# Patient Record
Sex: Female | Born: 1980 | Race: White | Hispanic: No | Marital: Married | Smoking: Current some day smoker
Health system: Southern US, Community
[De-identification: ages and names within clinical notes are randomized; demographics above are authoritative.]

---

## 2009-05-29 ENCOUNTER — Ambulatory Visit: Payer: Self-pay | Admitting: Family Medicine

## 2010-08-03 ENCOUNTER — Ambulatory Visit: Payer: Self-pay | Admitting: Internal Medicine

## 2010-11-14 ENCOUNTER — Ambulatory Visit: Payer: Self-pay | Admitting: Internal Medicine

## 2011-02-23 ENCOUNTER — Emergency Department: Payer: Self-pay | Admitting: Emergency Medicine

## 2011-04-30 ENCOUNTER — Emergency Department: Payer: Self-pay | Admitting: Emergency Medicine

## 2011-12-20 ENCOUNTER — Ambulatory Visit: Payer: Self-pay | Admitting: Family Medicine

## 2011-12-20 LAB — URINALYSIS, COMPLETE
Bacteria: NEGATIVE
Bilirubin,UR: NEGATIVE
Glucose,UR: NEGATIVE mg/dL (ref 0–75)
Leukocyte Esterase: NEGATIVE
Ph: 7 (ref 4.5–8.0)

## 2012-01-18 ENCOUNTER — Ambulatory Visit: Payer: Self-pay | Admitting: Emergency Medicine

## 2012-07-25 ENCOUNTER — Encounter: Payer: Self-pay | Admitting: Obstetrics and Gynecology

## 2012-08-10 ENCOUNTER — Ambulatory Visit: Payer: Self-pay

## 2012-08-10 LAB — CBC WITH DIFFERENTIAL/PLATELET
Basophil #: 0 10*3/uL (ref 0.0–0.1)
Basophil %: 0.5 %
Eosinophil #: 0.2 10*3/uL (ref 0.0–0.7)
HCT: 37.8 % (ref 35.0–47.0)
HGB: 12.8 g/dL (ref 12.0–16.0)
Lymphocyte %: 30.6 %
MCH: 30.2 pg (ref 26.0–34.0)
MCHC: 33.9 g/dL (ref 32.0–36.0)
MCV: 89 fL (ref 80–100)
Monocyte #: 0.7 x10 3/mm (ref 0.2–0.9)
Monocyte %: 10.4 %
Platelet: 210 10*3/uL (ref 150–440)
RBC: 4.25 10*6/uL (ref 3.80–5.20)
WBC: 6.6 10*3/uL (ref 3.6–11.0)

## 2012-08-10 LAB — BASIC METABOLIC PANEL
Anion Gap: 8 (ref 7–16)
BUN: 7 mg/dL (ref 7–18)
Calcium, Total: 8.7 mg/dL (ref 8.5–10.1)
Co2: 24 mmol/L (ref 21–32)
Creatinine: 0.54 mg/dL — ABNORMAL LOW (ref 0.60–1.30)
EGFR (African American): >60
Osmolality: 271 (ref 275–301)

## 2012-08-11 ENCOUNTER — Ambulatory Visit: Payer: Self-pay

## 2012-08-15 ENCOUNTER — Emergency Department: Payer: Self-pay | Admitting: Emergency Medicine

## 2012-08-15 LAB — CBC
HCT: 37.8 % (ref 35.0–47.0)
HGB: 13.4 g/dL (ref 12.0–16.0)
MCH: 31.2 pg (ref 26.0–34.0)
MCV: 88 fL (ref 80–100)
Platelet: 190 10*3/uL (ref 150–440)
RBC: 4.31 10*6/uL (ref 3.80–5.20)
RDW: 12.8 % (ref 11.5–14.5)
WBC: 9.7 10*3/uL (ref 3.6–11.0)

## 2012-08-15 LAB — COMPREHENSIVE METABOLIC PANEL
Alkaline Phosphatase: 74 U/L (ref 50–136)
Anion Gap: 6 — ABNORMAL LOW (ref 7–16)
BUN: 8 mg/dL (ref 7–18)
Bilirubin,Total: 0.9 mg/dL (ref 0.2–1.0)
Calcium, Total: 8.7 mg/dL (ref 8.5–10.1)
EGFR (African American): >60
EGFR (Non-African Amer.): >60
Glucose: 99 mg/dL (ref 65–99)
Potassium: 3.2 mmol/L — ABNORMAL LOW (ref 3.5–5.1)
Sodium: 137 mmol/L (ref 136–145)

## 2012-08-15 LAB — HCG, QUANTITATIVE, PREGNANCY: Beta Hcg, Quant.: 1928 m[IU]/mL — ABNORMAL HIGH

## 2012-11-18 ENCOUNTER — Emergency Department: Payer: Self-pay | Admitting: Emergency Medicine

## 2012-11-18 LAB — CBC
MCHC: 34.7 g/dL (ref 32.0–36.0)
MCV: 88 fL (ref 80–100)
RDW: 12.9 % (ref 11.5–14.5)
WBC: 10.5 10*3/uL (ref 3.6–11.0)

## 2012-11-19 LAB — URINALYSIS, COMPLETE
Bilirubin,UR: NEGATIVE
Glucose,UR: NEGATIVE mg/dL (ref 0–75)
Ph: 8 (ref 4.5–8.0)
Protein: NEGATIVE
RBC,UR: 6 /HPF (ref 0–5)
Squamous Epithelial: 6
WBC UR: 1 /HPF (ref 0–5)

## 2012-11-19 LAB — HCG, QUANTITATIVE, PREGNANCY: Beta Hcg, Quant.: 416 m[IU]/mL — ABNORMAL HIGH

## 2012-11-19 LAB — GC/CHLAMYDIA PROBE AMP

## 2012-11-19 LAB — WET PREP, GENITAL

## 2013-01-04 ENCOUNTER — Ambulatory Visit: Payer: Self-pay | Admitting: Physician Assistant

## 2013-01-04 LAB — URINALYSIS, COMPLETE
Glucose,UR: NEGATIVE mg/dL (ref 0–75)
Nitrite: NEGATIVE
Ph: 7 (ref 4.5–8.0)
Protein: NEGATIVE
WBC UR: NONE SEEN /HPF (ref 0–5)

## 2013-01-04 LAB — COMPREHENSIVE METABOLIC PANEL WITH GFR
Albumin: 4.3 g/dL
Alkaline Phosphatase: 63 U/L
Anion Gap: 7
BUN: 9 mg/dL
Bilirubin,Total: 0.9 mg/dL
Calcium, Total: 9.1 mg/dL
Chloride: 104 mmol/L
Co2: 29 mmol/L
Creatinine: 0.81 mg/dL
EGFR (African American): >60
EGFR (Non-African Amer.): >60
Glucose: 93 mg/dL
Osmolality: 278
Potassium: 3.6 mmol/L
SGOT(AST): 14 U/L — ABNORMAL LOW
SGPT (ALT): 21 U/L
Sodium: 140 mmol/L
Total Protein: 7.4 g/dL

## 2013-01-04 LAB — CBC WITH DIFFERENTIAL/PLATELET
Basophil #: 0.1 x10 3/mm 3
Basophil %: 1.4 %
Eosinophil #: 0.1 x10 3/mm 3
Eosinophil %: 1.7 %
HCT: 39.1 %
HGB: 13.5 g/dL
Lymphocyte %: 34.1 %
Lymphs Abs: 2.6 x10 3/mm 3
MCH: 29.8 pg
MCHC: 34.5 g/dL
MCV: 87 fL
Monocyte #: 0.6 "x10 3/mm "
Monocyte %: 8.3 %
Neutrophil #: 4.1 x10 3/mm 3
Neutrophil %: 54.5 %
Platelet: 234 x10 3/mm 3
RBC: 4.53 X10 6/mm 3
RDW: 12.2 %
WBC: 7.6 x10 3/mm 3

## 2013-01-04 LAB — AMYLASE: Amylase: 43 U/L

## 2013-02-17 ENCOUNTER — Emergency Department: Payer: Self-pay | Admitting: Emergency Medicine

## 2013-02-18 LAB — CBC
MCH: 30.6 pg (ref 26.0–34.0)
MCHC: 35 g/dL (ref 32.0–36.0)
Platelet: 216 10*3/uL (ref 150–440)
RDW: 12.7 % (ref 11.5–14.5)
WBC: 10.7 10*3/uL (ref 3.6–11.0)

## 2013-02-18 LAB — URINALYSIS, COMPLETE
Bilirubin,UR: NEGATIVE
Blood: NEGATIVE
Glucose,UR: NEGATIVE mg/dL (ref 0–75)
Leukocyte Esterase: NEGATIVE
Nitrite: NEGATIVE
Ph: 8 (ref 4.5–8.0)
Protein: NEGATIVE
RBC,UR: 1 /HPF (ref 0–5)
Specific Gravity: 1.009 (ref 1.003–1.030)
WBC UR: <1 /HPF (ref 0–5)

## 2013-02-18 LAB — BASIC METABOLIC PANEL WITH GFR
Anion Gap: 5 — ABNORMAL LOW
BUN: 10 mg/dL
Calcium, Total: 9.1 mg/dL
Chloride: 107 mmol/L
Co2: 27 mmol/L
Creatinine: 0.81 mg/dL
EGFR (African American): >60
EGFR (Non-African Amer.): >60
Glucose: 99 mg/dL
Osmolality: 277
Potassium: 3.6 mmol/L
Sodium: 139 mmol/L

## 2013-02-18 LAB — GC/CHLAMYDIA PROBE AMP

## 2013-02-18 LAB — WET PREP, GENITAL

## 2013-03-07 ENCOUNTER — Ambulatory Visit: Payer: Self-pay | Admitting: Gastroenterology

## 2013-03-08 ENCOUNTER — Ambulatory Visit: Payer: Self-pay | Admitting: Gastroenterology

## 2013-05-30 ENCOUNTER — Emergency Department: Payer: Self-pay | Admitting: Emergency Medicine

## 2013-05-30 LAB — CBC WITH DIFFERENTIAL/PLATELET
BASOS ABS: 0 10*3/uL (ref 0.0–0.1)
BASOS PCT: 0.6 %
Eosinophil #: 0.4 10*3/uL (ref 0.0–0.7)
Eosinophil %: 4 %
HCT: 39.3 % (ref 35.0–47.0)
HGB: 13.2 g/dL (ref 12.0–16.0)
LYMPHS PCT: 36.5 %
Lymphocyte #: 3.2 10*3/uL (ref 1.0–3.6)
MCH: 30.3 pg (ref 26.0–34.0)
MCHC: 33.6 g/dL (ref 32.0–36.0)
MCV: 90 fL (ref 80–100)
MONO ABS: 0.7 x10 3/mm (ref 0.2–0.9)
MONOS PCT: 8.2 %
NEUTROS ABS: 4.4 10*3/uL (ref 1.4–6.5)
Neutrophil %: 50.7 %
PLATELETS: 224 10*3/uL (ref 150–440)
RBC: 4.36 10*6/uL (ref 3.80–5.20)
RDW: 12.6 % (ref 11.5–14.5)
WBC: 8.7 10*3/uL (ref 3.6–11.0)

## 2013-05-30 LAB — COMPREHENSIVE METABOLIC PANEL
ALBUMIN: 4.2 g/dL (ref 3.4–5.0)
ALT: 29 U/L (ref 12–78)
Alkaline Phosphatase: 59 U/L
Anion Gap: 6 — ABNORMAL LOW (ref 7–16)
BILIRUBIN TOTAL: 0.3 mg/dL (ref 0.2–1.0)
BUN: 10 mg/dL (ref 7–18)
CALCIUM: 8.8 mg/dL (ref 8.5–10.1)
CREATININE: 0.64 mg/dL (ref 0.60–1.30)
Chloride: 103 mmol/L (ref 98–107)
Co2: 26 mmol/L (ref 21–32)
EGFR (Non-African Amer.): >60
Glucose: 126 mg/dL — ABNORMAL HIGH (ref 65–99)
Osmolality: 271 (ref 275–301)
POTASSIUM: 3.4 mmol/L — AB (ref 3.5–5.1)
SGOT(AST): 25 U/L (ref 15–37)
Sodium: 135 mmol/L — ABNORMAL LOW (ref 136–145)
Total Protein: 7 g/dL (ref 6.4–8.2)

## 2013-05-30 LAB — URINALYSIS, COMPLETE
BILIRUBIN, UR: NEGATIVE
Glucose,UR: NEGATIVE mg/dL (ref 0–75)
KETONE: NEGATIVE
Leukocyte Esterase: NEGATIVE
NITRITE: NEGATIVE
PROTEIN: NEGATIVE
Ph: 6 (ref 4.5–8.0)
SPECIFIC GRAVITY: 1.018 (ref 1.003–1.030)
Squamous Epithelial: 9
WBC UR: <1 /HPF (ref 0–5)

## 2013-06-01 LAB — URINE CULTURE

## 2013-06-06 ENCOUNTER — Ambulatory Visit: Payer: Self-pay | Admitting: Gastroenterology

## 2013-06-06 LAB — HCG, QUANTITATIVE, PREGNANCY

## 2013-07-05 ENCOUNTER — Encounter: Payer: Self-pay | Admitting: Nurse Practitioner

## 2013-07-09 ENCOUNTER — Encounter: Payer: Self-pay | Admitting: Nurse Practitioner

## 2013-08-30 ENCOUNTER — Ambulatory Visit: Payer: Self-pay | Admitting: Physician Assistant

## 2013-09-14 ENCOUNTER — Ambulatory Visit: Payer: Self-pay | Admitting: Gastroenterology

## 2013-12-06 ENCOUNTER — Emergency Department: Payer: Self-pay | Admitting: Emergency Medicine

## 2013-12-06 LAB — BASIC METABOLIC PANEL
ANION GAP: 5 — AB (ref 7–16)
BUN: 8 mg/dL (ref 7–18)
CO2: 30 mmol/L (ref 21–32)
Calcium, Total: 8.7 mg/dL (ref 8.5–10.1)
Chloride: 104 mmol/L (ref 98–107)
Creatinine: 0.76 mg/dL (ref 0.60–1.30)
EGFR (African American): >60
EGFR (Non-African Amer.): >60
Glucose: 95 mg/dL (ref 65–99)
Osmolality: 276 (ref 275–301)
POTASSIUM: 3.4 mmol/L — AB (ref 3.5–5.1)
SODIUM: 139 mmol/L (ref 136–145)

## 2013-12-06 LAB — CBC WITH DIFFERENTIAL/PLATELET
Basophil #: 0.1 10*3/uL (ref 0.0–0.1)
Basophil %: 0.8 %
Eosinophil #: 0.4 10*3/uL (ref 0.0–0.7)
Eosinophil %: 4 %
HCT: 42.1 % (ref 35.0–47.0)
HGB: 13.9 g/dL (ref 12.0–16.0)
LYMPHS PCT: 39.5 %
Lymphocyte #: 4 10*3/uL — ABNORMAL HIGH (ref 1.0–3.6)
MCH: 30 pg (ref 26.0–34.0)
MCHC: 33 g/dL (ref 32.0–36.0)
MCV: 91 fL (ref 80–100)
Monocyte #: 0.7 x10 3/mm (ref 0.2–0.9)
Monocyte %: 7.1 %
NEUTROS PCT: 48.6 %
Neutrophil #: 4.9 10*3/uL (ref 1.4–6.5)
Platelet: 236 10*3/uL (ref 150–440)
RBC: 4.63 10*6/uL (ref 3.80–5.20)
RDW: 12.7 % (ref 11.5–14.5)
WBC: 10.1 10*3/uL (ref 3.6–11.0)

## 2013-12-06 LAB — URINALYSIS, COMPLETE
BACTERIA: NONE SEEN
Bilirubin,UR: NEGATIVE
GLUCOSE, UR: NEGATIVE mg/dL (ref 0–75)
KETONE: NEGATIVE
LEUKOCYTE ESTERASE: NEGATIVE
Nitrite: NEGATIVE
Ph: 7 (ref 4.5–8.0)
Protein: NEGATIVE
RBC,UR: 3 /HPF (ref 0–5)
SPECIFIC GRAVITY: 1.006 (ref 1.003–1.030)
WBC UR: 1 /HPF (ref 0–5)

## 2013-12-26 ENCOUNTER — Ambulatory Visit: Payer: Self-pay | Admitting: Internal Medicine

## 2014-05-17 ENCOUNTER — Emergency Department: Payer: Self-pay | Admitting: Emergency Medicine

## 2014-05-22 ENCOUNTER — Ambulatory Visit: Payer: Self-pay | Admitting: Otolaryngology

## 2014-05-23 ENCOUNTER — Emergency Department: Payer: Self-pay | Admitting: Emergency Medicine

## 2014-05-23 LAB — URINALYSIS, COMPLETE
Bacteria: NONE SEEN
Bilirubin,UR: NEGATIVE
GLUCOSE, UR: NEGATIVE mg/dL (ref 0–75)
Ketone: NEGATIVE
Leukocyte Esterase: NEGATIVE
NITRITE: NEGATIVE
PROTEIN: NEGATIVE
Ph: 6 (ref 4.5–8.0)
RBC,UR: <1 /HPF (ref 0–5)
SPECIFIC GRAVITY: 1.005 (ref 1.003–1.030)
Squamous Epithelial: 1
WBC UR: <1 /HPF (ref 0–5)

## 2014-05-23 LAB — COMPREHENSIVE METABOLIC PANEL
ALBUMIN: 4.3 g/dL (ref 3.4–5.0)
ALK PHOS: 70 U/L
Anion Gap: 8 (ref 7–16)
BUN: 14 mg/dL (ref 7–18)
Bilirubin,Total: 0.3 mg/dL (ref 0.2–1.0)
Calcium, Total: 9.2 mg/dL (ref 8.5–10.1)
Chloride: 103 mmol/L (ref 98–107)
Co2: 27 mmol/L (ref 21–32)
Creatinine: 0.89 mg/dL (ref 0.60–1.30)
EGFR (African American): >60
EGFR (Non-African Amer.): >60
GLUCOSE: 172 mg/dL — AB (ref 65–99)
OSMOLALITY: 280 (ref 275–301)
POTASSIUM: 3.8 mmol/L (ref 3.5–5.1)
SGOT(AST): 14 U/L — ABNORMAL LOW (ref 15–37)
SGPT (ALT): 20 U/L
SODIUM: 138 mmol/L (ref 136–145)
TOTAL PROTEIN: 7.7 g/dL (ref 6.4–8.2)

## 2014-05-23 LAB — CBC
HCT: 43.8 % (ref 35.0–47.0)
HGB: 14.4 g/dL (ref 12.0–16.0)
MCH: 29.7 pg (ref 26.0–34.0)
MCHC: 33 g/dL (ref 32.0–36.0)
MCV: 90 fL (ref 80–100)
Platelet: 310 10*3/uL (ref 150–440)
RBC: 4.86 10*6/uL (ref 3.80–5.20)
RDW: 12.6 % (ref 11.5–14.5)
WBC: 11.8 10*3/uL — ABNORMAL HIGH (ref 3.6–11.0)

## 2014-07-14 IMAGING — CT CT STONE STUDY
1 of 2 series · 15 of 32 positions shown, 19 images · non-contrast
Comparison: none

REASON FOR EXAM: back pain, hematuria
COMMENTS:

[Series 2: soft tissue · axial · 0.69mm/px · z∈[-918,-519]mm · 15 of 147 slices shown, 19 images]
[im 7/147  soft-tissue]
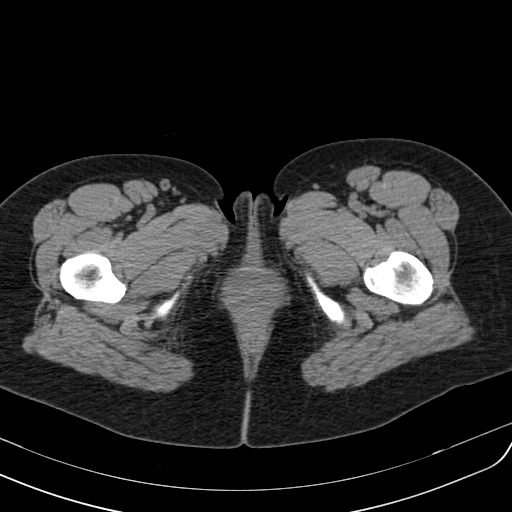
[im 7/147  bone]
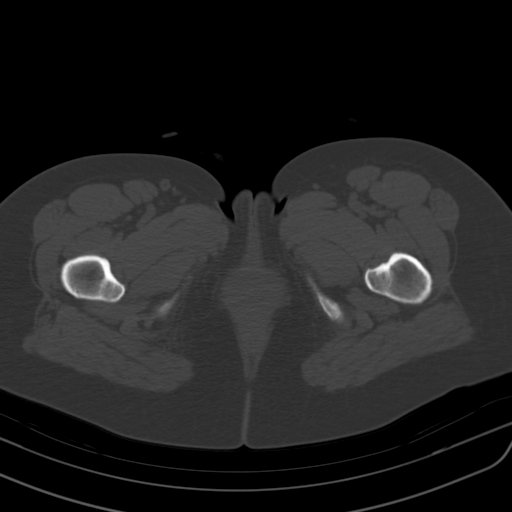
[im 20/147  soft-tissue]
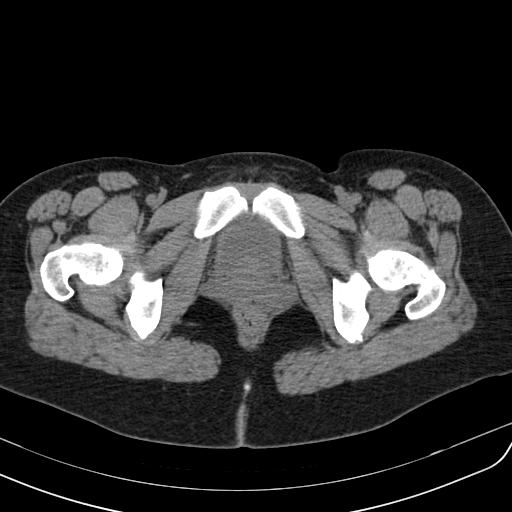
[im 32/147  soft-tissue]
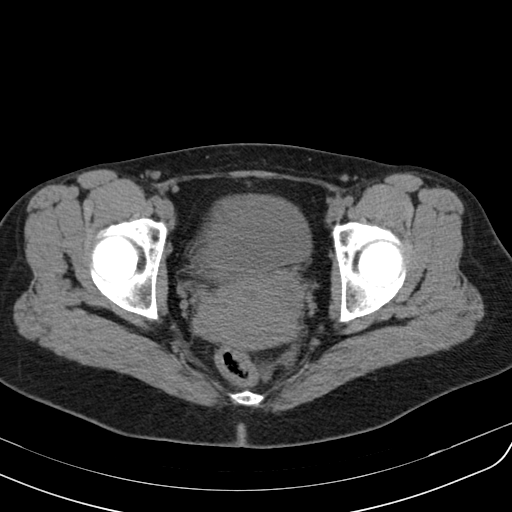
[im 39/147  soft-tissue]
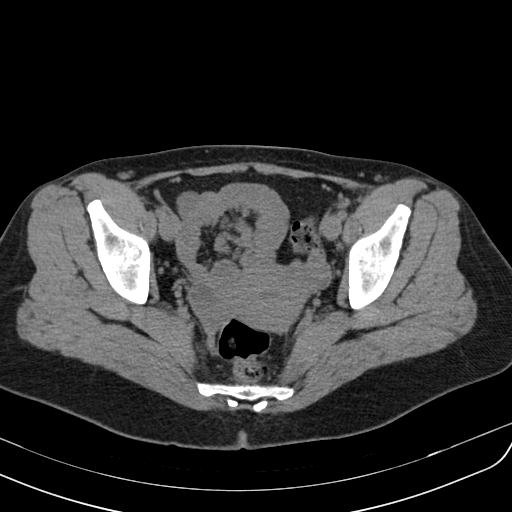
[im 51/147  soft-tissue]
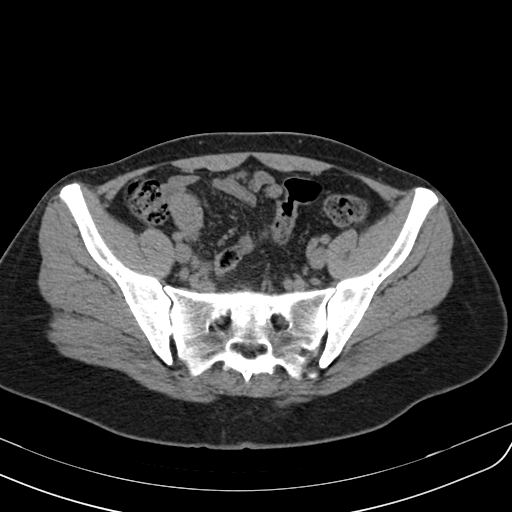
[im 64/147  soft-tissue]
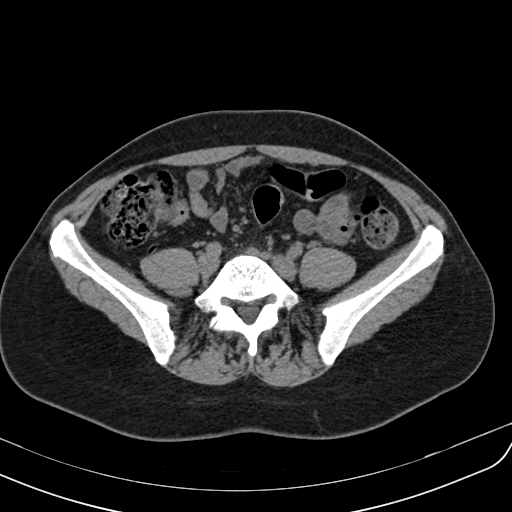
[im 77/147  soft-tissue]
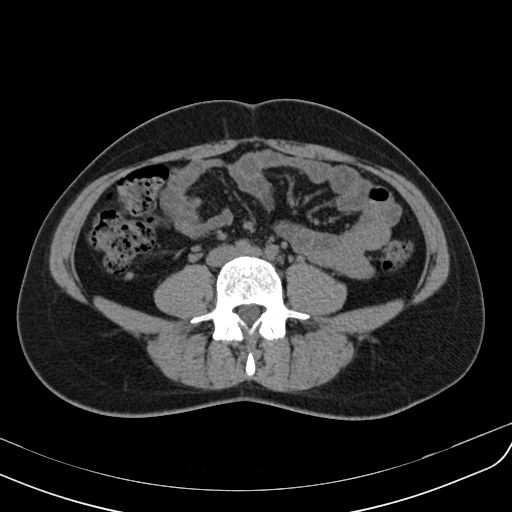
[im 83/147  soft-tissue]
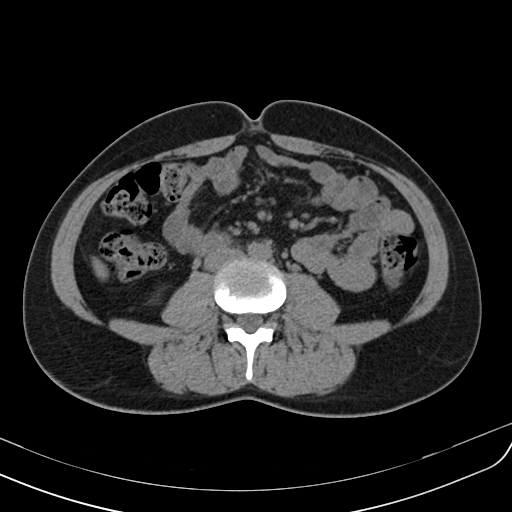
[im 96/147  soft-tissue]
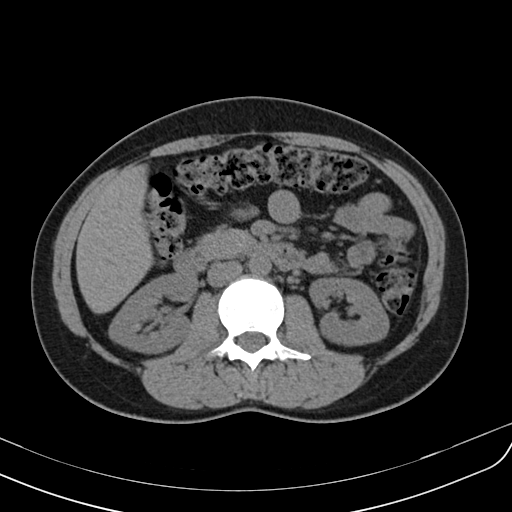
[im 96/147  bone]
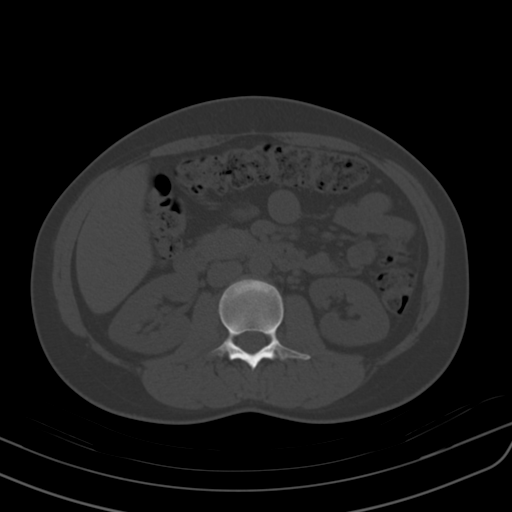
[im 108/147  soft-tissue]
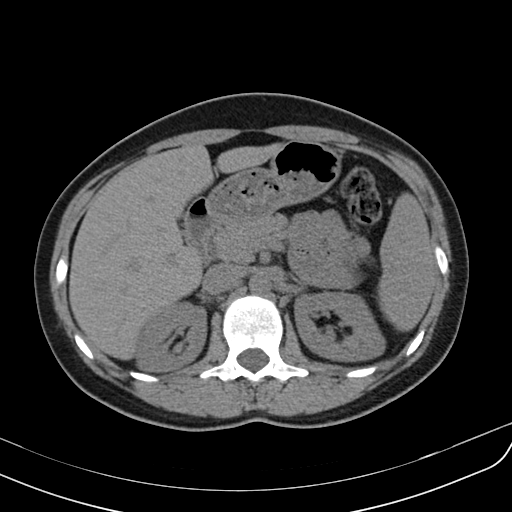
[im 115/147  soft-tissue]
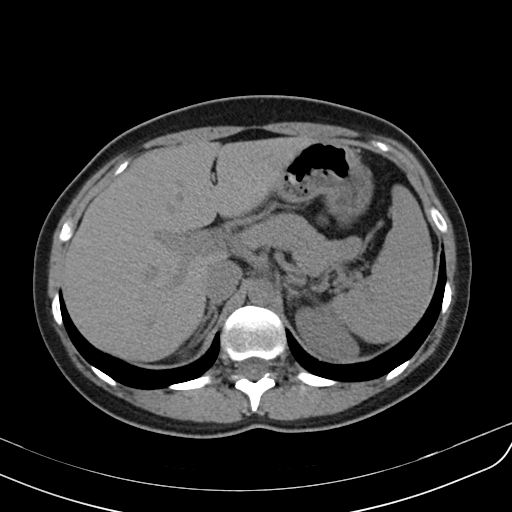
[im 121/147  lung]
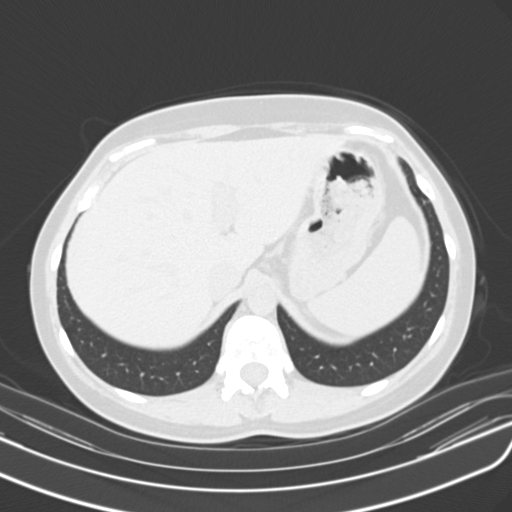
[im 127/147  soft-tissue]
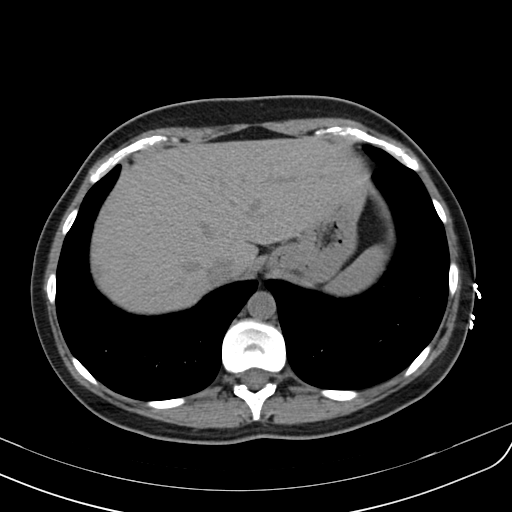
[im 127/147  lung]
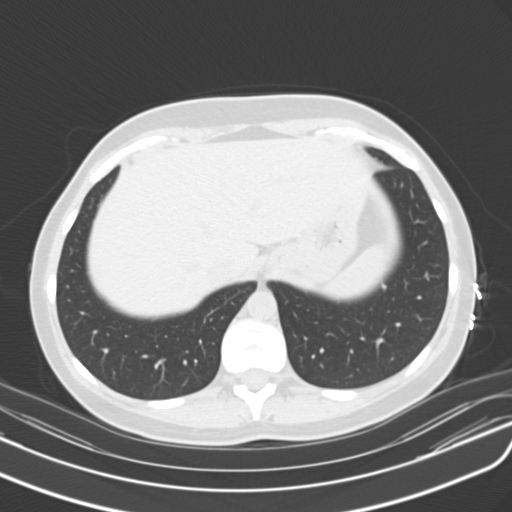
[im 134/147  lung]
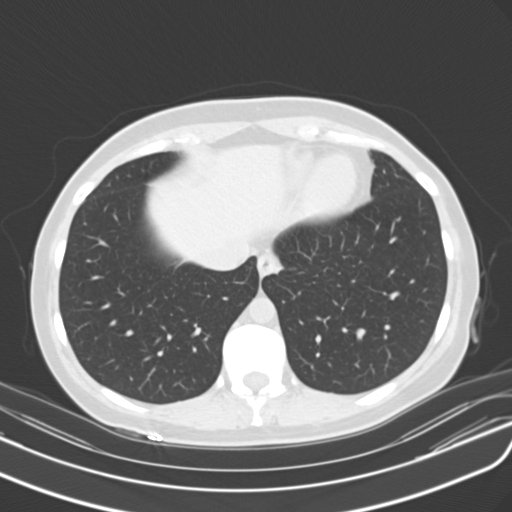
[im 140/147  soft-tissue]
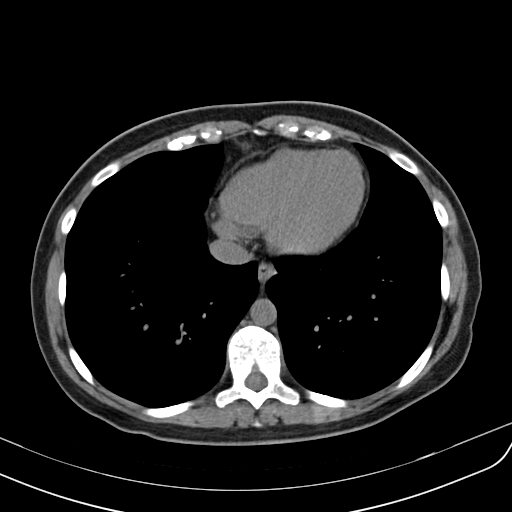
[im 140/147  lung]
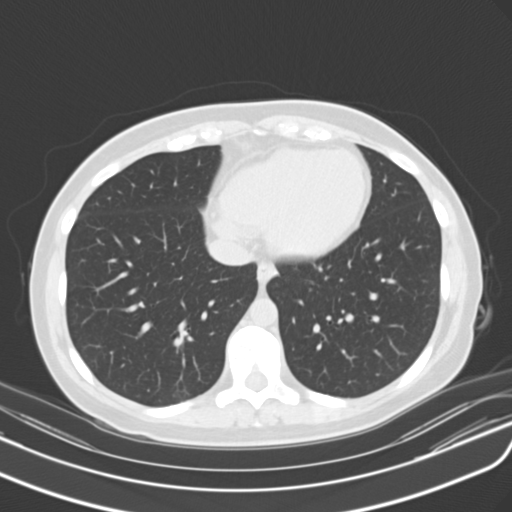

[15 of 32 positions shown; findings below may reference images not displayed]

PROCEDURE:     AMINU ADAMU - AMINU ADAMU ABDOMEN/PELVIS WO ( STONE)  - December 20, 2011  [DATE]

RESULT:     Renal stone protocol noncontrast CT of the abdomen and pelvis is
performed in the standard fashion. The patient has no previous exam for
comparison. Images are reconstructed in the axial plane at 3.0 mm slice
thickness.

There is no nephrolithiasis or hydronephrosis. No hydroureter is evident.
There is no bladder stone demonstrated. The uterus appears unremarkable. The
ovaries appear within normal limits. The appendix is normal. There is no
abnormal bowel distention or wall thickening evident. No radiopaque
gallstones are evident. The liver, spleen, pancreas, aorta and adrenal
glands appear normal. The kidneys show no definite mass. The lung bases
appear clear.
IMPRESSION: No evidence of nephrolithiasis or hydronephrosis.
Unremarkable CT of the abdomen and pelvis.

[REDACTED]

## 2014-07-18 ENCOUNTER — Encounter (HOSPITAL_COMMUNITY): Payer: Self-pay | Admitting: *Deleted

## 2014-07-18 ENCOUNTER — Emergency Department (HOSPITAL_COMMUNITY)
Admission: EM | Admit: 2014-07-18 | Discharge: 2014-07-18 | Disposition: A | Discharge status: Home / Self Care | Payer: 59 | Attending: Emergency Medicine | Admitting: Emergency Medicine

## 2014-07-18 ENCOUNTER — Emergency Department (HOSPITAL_COMMUNITY): Payer: 59

## 2014-07-18 DIAGNOSIS — R079 Chest pain, unspecified: Secondary | ICD-10-CM | POA: Diagnosis present

## 2014-07-18 DIAGNOSIS — R059 Cough, unspecified: Secondary | ICD-10-CM

## 2014-07-18 DIAGNOSIS — R05 Cough: Secondary | ICD-10-CM | POA: Diagnosis not present

## 2014-07-18 DIAGNOSIS — Z720 Tobacco use: Secondary | ICD-10-CM | POA: Insufficient documentation

## 2014-07-18 DIAGNOSIS — R0981 Nasal congestion: Secondary | ICD-10-CM | POA: Insufficient documentation

## 2014-07-18 DIAGNOSIS — Z791 Long term (current) use of non-steroidal anti-inflammatories (NSAID): Secondary | ICD-10-CM | POA: Insufficient documentation

## 2014-07-18 LAB — CBC
HCT: 39.6 % (ref 36.0–46.0)
HEMOGLOBIN: 13.6 g/dL (ref 12.0–15.0)
MCH: 30.4 pg (ref 26.0–34.0)
MCHC: 34.3 g/dL (ref 30.0–36.0)
MCV: 88.6 fL (ref 78.0–100.0)
Platelets: 219 10*3/uL (ref 150–400)
RBC: 4.47 MIL/uL (ref 3.87–5.11)
RDW: 12.4 % (ref 11.5–15.5)
WBC: 7.6 10*3/uL (ref 4.0–10.5)

## 2014-07-18 LAB — BASIC METABOLIC PANEL
ANION GAP: 6 (ref 5–15)
BUN: 11 mg/dL (ref 6–23)
CALCIUM: 9.1 mg/dL (ref 8.4–10.5)
CO2: 25 mmol/L (ref 19–32)
CREATININE: 0.7 mg/dL (ref 0.50–1.10)
Chloride: 108 mmol/L (ref 96–112)
GFR calc Af Amer: >90 mL/min (ref >90)
Glucose, Bld: 111 mg/dL — ABNORMAL HIGH (ref 70–99)
Potassium: 3.6 mmol/L (ref 3.5–5.1)
Sodium: 139 mmol/L (ref 135–145)

## 2014-07-18 LAB — I-STAT TROPONIN, ED: Troponin i, poc: 0 ng/mL (ref 0.00–0.08)

## 2014-07-18 MED ORDER — NAPROXEN 500 MG PO TABS: 500.0000 mg | ORAL_TABLET | Freq: Two times a day (BID) | ORAL | Status: AC

## 2014-07-18 NOTE — ED Notes (Signed)
Pt reports recent bronchitis, still has a cough but started having constant chest pains 2 days ago. Also reports occ mid back pains, right arm pain and neck stiffness.

## 2014-07-18 NOTE — ED Provider Notes (Signed)
CSN: [id number]     Arrival date & time 07/18/14  1051 History   First MD Initiated Contact with Patient 07/18/14 1328     Chief Complaint  Patient presents with  . Chest Pain  . Cough  . Back Pain     (Consider location/radiation/quality/duration/timing/severity/associated sxs/prior Treatment) HPI Anna Brown is a 34 y.o. female  With no medical problems presents to ED with complaint of cough and chest pain. Patient states that Anna Brown had bronchitis approximately a month ago. Was treated with antibiotics, steroid pack, inhaler. States symptoms improving, however Anna Brown still continues to have a cough. Approximately 3 days ago Anna Brown started having constant chest pain that is in the center of the chest. It is not worsened with breathing or coughing. It is independent of laying down or sitting up. Anna Brown denies any fever or chills. Anna Brown denies any shortness of breath. Anna Brown has not tried any medications for this. Anna Brown is a current every day smoker. Anna Brown has no medical problems and is not immunocompromised. Anna Brown states intermittently Anna Brown will develop sharp chest pains in the right side that radiated into the right back and right arm. States these only happen once or twice a day and last for 1 second on time. Patient states Anna Brown was fortunately have pneumonia so Anna Brown came to emergency department.  History reviewed. No pertinent past medical history. History reviewed. No pertinent past surgical history. History reviewed. No pertinent family history. History  Substance Use Topics  . Smoking status: Current Some Day Smoker  . Smokeless tobacco: Not on file  . Alcohol Use: Yes     Comment: occ   OB History    No data available     Review of Systems  Constitutional: Negative for fever and chills.  HENT: Positive for congestion.   Respiratory: Positive for cough and chest tightness. Negative for shortness of breath.   Cardiovascular: Positive for chest pain. Negative for palpitations and leg swelling.   Gastrointestinal: Negative for nausea, vomiting, abdominal pain and diarrhea.  Genitourinary: Negative for dysuria, flank pain and pelvic pain.  Musculoskeletal: Negative for myalgias, arthralgias, neck pain and neck stiffness.  Skin: Negative for rash.  Neurological: Negative for dizziness, weakness and headaches.  All other systems reviewed and are negative.     Allergies  Review of patient's allergies indicates no known allergies.  Home Medications   Prior to Admission medications   Medication Sig Start Date End Date Taking? Authorizing Provider  naproxen (NAPROSYN) 500 MG tablet Take 1 tablet (500 mg total) by mouth 2 (two) times daily. 07/18/14   Marrissa Dai, PA-C   BP 105/69 mmHg  Pulse 74  Temp(Src) 98.1 F (36.7 C) (Oral)  Resp 12  SpO2 100%  LMP 07/11/2014 Physical Exam  Constitutional: Anna Brown is oriented to person, place, and time. Anna Brown appears well-developed and well-nourished. No distress.  HENT:  Head: Normocephalic and atraumatic.  Right Ear: External ear normal.  Left Ear: External ear normal.  Nose: Nose normal.  Mouth/Throat: Oropharynx is clear and moist.  Eyes: Conjunctivae are normal. Pupils are equal, round, and reactive to light.  Neck: Normal range of motion. Neck supple.  Cardiovascular: Normal rate, regular rhythm and normal heart sounds.   Pulmonary/Chest: Effort normal and breath sounds normal. No respiratory distress. Anna Brown has no wheezes. Anna Brown has no rales. Anna Brown exhibits no tenderness.  Abdominal: Soft. Bowel sounds are normal. Anna Brown exhibits no distension. There is no tenderness. There is no rebound.  Musculoskeletal: Anna Brown exhibits no edema.  Neurological: Anna Brown is alert and oriented to person, place, and time.  Skin: Skin is warm and dry.  Psychiatric: Anna Brown has a normal mood and affect. Her behavior is normal.  Nursing note and vitals reviewed.   ED Course  Procedures (including critical care time) Labs Review Labs Reviewed  BASIC METABOLIC  PANEL - Abnormal; Notable for the following:    Glucose, Bld 111 (*)    All other components within normal limits  CBC  I-STAT TROPOININ, ED    Imaging Review Dg Chest 2 View  07/18/2014   CLINICAL DATA:  Shortness of breath and cough. Diagnosis of bronchitis 6 weeks ago.  EXAM: CHEST  2 VIEW  COMPARISON:  CT chest and PA and lateral chest 01/04/2013.  FINDINGS: The lungs are clear. Heart size is normal. There is no pneumothorax or pleural effusion. No focal bony abnormality is identified.  IMPRESSION: Negative chest.   Electronically Signed   By: Drusilla Kannerhomas  Dalessio M.D.   On: 07/18/2014 12:45     EKG Interpretation   Date/Time:  Wednesday July 18 2014 11:03:20 EST Ventricular Rate:  83 PR Interval:  144 QRS Duration: 78 QT Interval:  374 QTC Calculation: 439 R Axis:   79 Text Interpretation:  Normal sinus rhythm Right atrial enlargement  Nonspecific T wave abnormality Abnormal ECG No old tracing to compare  Confirmed by NANAVATI, MD, Janey GentaANKIT [phone]([id number]) on 07/18/2014 1:52:28 PM      MDM   Final diagnoses:  Chest pain, unspecified chest pain type  Cough    Patient is here with constant chest pain for 3 days after having upper respiratory tract infection, bronchitis. Her exam is unremarkable. Anna Brown is PERC negative, doubt PE. Question chest wall pain from cough vs pericarditis. Heart normal on CXR> lungs clear. EKG unremarkable. No risk factors are concerning for ACS, troponin 1 here is negative. We'll discharge home with NSAIDs for possible pericarditis, advised to start taking allergy medications for postnasal drainage, also to use her inhaler 2 puffs every 4 hours as needed. Request to stop smoking. Follow-up with primary care doctor.  Filed Vitals:   07/18/14 1056 07/18/14 1356  BP: 106/70 105/69  Pulse: 82 74  Temp: 98.1 F (36.7 C) 98.1 F (36.7 C)  TempSrc: Oral Oral  Resp: 20 12  SpO2: 100% 100%     Jaynie Crumbleatyana Yara Tomkinson, PA-C 07/18/14 1628  Derwood KaplanAnkit Nanavati, MD 07/20/14  [phone]

## 2014-07-18 NOTE — Discharge Instructions (Signed)
Naprosyn for inflammation. Take your inhaler 2 puffs every 4 hrs. Follow up with your doctor if not improving. Stop smoking.    Chest Pain (Nonspecific) It is often hard to give a diagnosis for the cause of chest pain. There is always a chance that your pain could be related to something serious, such as a heart attack or a blood clot in the lungs. You need to follow up with your doctor. HOME CARE  If antibiotic medicine was given, take it as directed by your doctor. Finish the medicine even if you start to feel better.  For the next few days, avoid activities that bring on chest pain. Continue physical activities as told by your doctor.  Do not use any tobacco products. This includes cigarettes, chewing tobacco, and e-cigarettes.  Avoid drinking alcohol.  Only take medicine as told by your doctor.  Follow your doctor's suggestions for more testing if your chest pain does not go away.  Keep all doctor visits you made. GET HELP IF:  Your chest pain does not go away, even after treatment.  You have a rash with blisters on your chest.  You have a fever. GET HELP RIGHT AWAY IF:   You have more pain or pain that spreads to your arm, neck, jaw, back, or belly (abdomen).  You have shortness of breath.  You cough more than usual or cough up blood.  You have very bad back or belly pain.  You feel sick to your stomach (nauseous) or throw up (vomit).  You have very bad weakness.  You pass out (faint).  You have chills. This is an emergency. Do not wait to see if the problems will go away. Call your local emergency services (911 in [place] Do not drive yourself to the hospital. MAKE SURE YOU:   Understand these instructions.  Will watch your condition.  Will get help right away if you are not doing well or get worse. Document Released: 10/14/2007 Document Revised: 05/02/2013 Document Reviewed: 10/14/2007 [facility] Patient Information 2015 [employer], [place]. This information is  not intended to replace advice given to you by your health care provider. Make sure you discuss any questions you have with your health care provider.

## 2014-08-31 NOTE — Consult Note (Signed)
Referral Information:  Reason for Referral Heart palpitations and need for medication during her last pregnancy   Referring Physician [facility]   Prenatal Hx Patient reports heart palpitations and skipped beats since she was 34yo.  Never saw a physician regarding this until she was pregnant in 2011 at which time her symptoms became much more severe.  She states that she had an echocardiogram which demonstrated "back wash" but was told that it wasn't serious.  She was advised to take some medication (she doesn't remember the name of it) which helped but didn't completely resolve her symptoms.  After the pregnancy, she discontinued the medication.  She continues to have symptoms which again have gotten worse and actually are what prompted her to take a pregnancy test.  She denies ever having an EKG or Holter monitor.   Past Obstetrical Hx 1.  05/2010 NVD at 42 weeks after induction of labor; female infant, 8#5oz (delivered at [facility]) 2.  Current   Home Medications: Medication Instructions Status  Prenatal Multivitamins Prenatal Multivitamins oral tablet 1 tab(s) orally once a day Active   Allergies:   No Known Allergies:   Vital Signs/Notes:  Nursing Vital Signs: **Vital Signs.:   17-Mar-14 08:51  Vital Signs Type Routine  Temperature Temperature (F) 97.9  Celsius 36.6  Pulse Pulse 95  Respirations Respirations 18  Systolic BP Systolic BP 105  Diastolic BP (mmHg) Diastolic BP (mmHg) 56  Mean BP 72  Pulse Ox % Pulse Ox % 99   Perinatal Consult:  LMP 30-May-2012   PGyn Hx Denies history of abnormal PAPs or STDs   PMed Hx Hx of varicella   Past Medical History cont'd Patient reports heart palpitations and skipped beats since she was 34yo. She states that symptoms are worse with smoking (she quit 2-3 weeks ago) and caffeine.  She didn't see a physician regarding this until she was pregnant in 2011 at which time her symptoms became much more severe.  She states  that she had an echocardiogram which demonstrated "back wash" but was told that it wasn't serious - saw a cardiologist, Dr. Joannie Springs at [facility].  She was advised to take some medication (she doesn't remember the name of it) starting at about 5-6 "months" which helped but didn't completely resolve her symptoms.  After the pregnancy, she discontinued the medication.  She continues to have symptoms which again have gotten worse and actually are what prompted her to take a pregnancy test.  She denies ever having an EKG or Holter monitor.  She denies shortness of breath or chest pain with these symptoms but they do cause anxiety.   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   Soc Hx Married; quit smoking about 2-3 weeks ago, denies ETOH and drugs   Review Of Systems:  Fever/Chills No   Cough No   Abdominal Pain No   Diarrhea No   Constipation No   Nausea/Vomiting No   SOB/DOE No   Chest Pain No   Dysuria No   Tolerating Diet Yes   Medications/Allergies Reviewed Medications/Allergies reviewed   Impression/Recommendations:  Impression 34yo G2P1001 at [redacted]w[redacted]d by LMP with history of heart palpitations and ? abnormalities on an echocardiogram during her last pregnancy.  Records currently not available for review.  Patient describes "back wash" and being put on medication but no other details.   Recommendations 1.  Will have patient sign medical release to obtain records [facility] and the obstetrician from her last pregnancy [facility]). 2.  Will order echocardiogram and 24hr Holter monitor 3.  RTC 2wks to review results - [facility] there were [facility] abnormalities as the patient requested a new cardiologist if  indicated. 4.  Consider evaluation for cerebral aneurysms given that both her father and her paternal grandfather had aneurysms that required surgery.    Total Time Spent with Patient 30 minutes   >50% of visit spent in couseling/coordination of care yes   Office Use Only [address]  Level 2 (30min) NEW office consult exp prob focused   Coding Description: MATERNAL CONDITIONS/HISTORY INDICATION(S).   Cardiac disease, maternal - Cardiomyopathy or CAD.  Electronic Signatures: Kirby FunkEllestad, Myrick Mcnairy (MD)  (Signed 17-Mar-14 11:21)  Authored: Referral, Home Medications, Allergies, Vital Signs/Notes, Consult, Exam, Impression, Plan, Billing, Coding Description   Last Updated: 17-Mar-14 11:21 by Kirby FunkEllestad, Chanon Loney (MD)

## 2014-08-31 NOTE — Op Note (Signed)
PATIENT NAME:  Anna PolioNDREWS, Anna D MR#:  [id number] DATE OF BIRTH:  17-Feb-1981  DATE OF PROCEDURE:  08/11/2012  PREOPERATIVE DIAGNOSIS: Missed abortion.   POSTOPERATIVE DIAGNOSIS: Missed abortion.   OPERATION PERFORMED: Suction curettage   OPERATIVE FINDINGS: Expected amount of tissue.   SURGEON:  Kate SablePhilip Ramzi Brathwaite, MD  ANESTHESIA:  General.  DESCRIPTION OF PROCEDURE: After adequate general anesthesia, the patient was prepped and draped in routine fashion. The cervix was grasped with a Jacobs tenaculum and dilated with ease. The uterine cavity was systematically suction curetted with a #10 suction curettage. Curettage was performed with a sharp curette and revealed a " "clean" field for 360 degrees.  The patient tolerated the procedure well and left the operating room in good condition. Sponge and needle counts were said to be correct at the end of the procedure.     ____________________________ Deloris PingPhilip J. Luella Cookosenow, MD pjr:ct D: 08/11/2012 11:44:44 ET T: 08/11/2012 11:52:08 ET JOB#: [id number]  cc: Deloris PingPhilip J. Luella Cookosenow, MD, <Dictator> Towana BadgerPHILIP J Reino Lybbert MD ELECTRONICALLY SIGNED 08/17/2012 81:1920:02

## 2015-06-14 IMAGING — US US OB < 14 WEEKS - US OB TV
1 series · 13 of 28 positions shown · non-contrast
Comparison: none

REASON FOR EXAM: vaginal bleeding/abd pain  -  +home pregnancy test
COMMENTS:

[Series 1: us ob < 14 weeks - us ob tv · 0.20mm/px · 83 acquisitions, 13 frames shown]
[im 4/83]
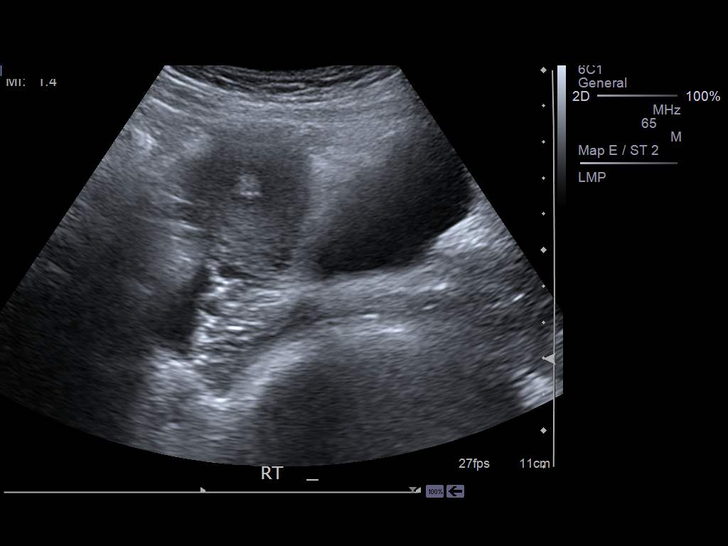
[im 10/83]
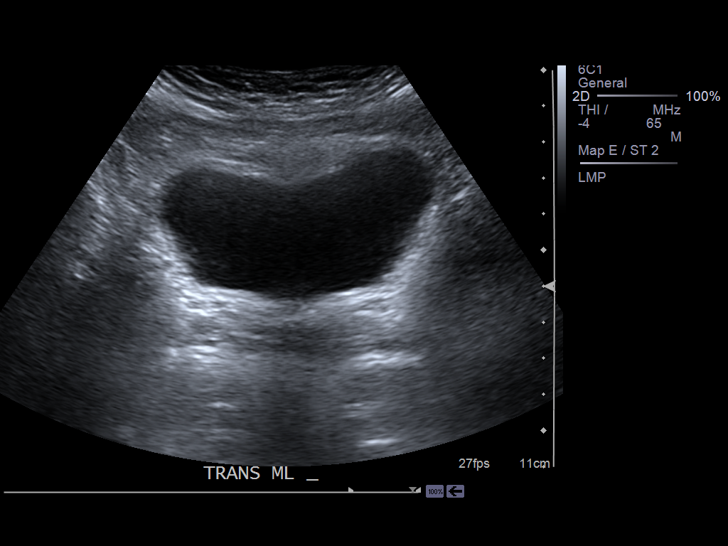
[im 16/83]
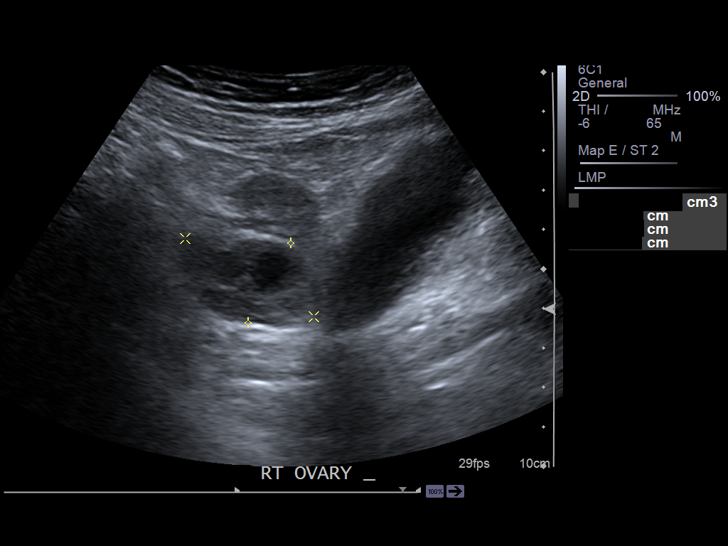
[im 22/83]
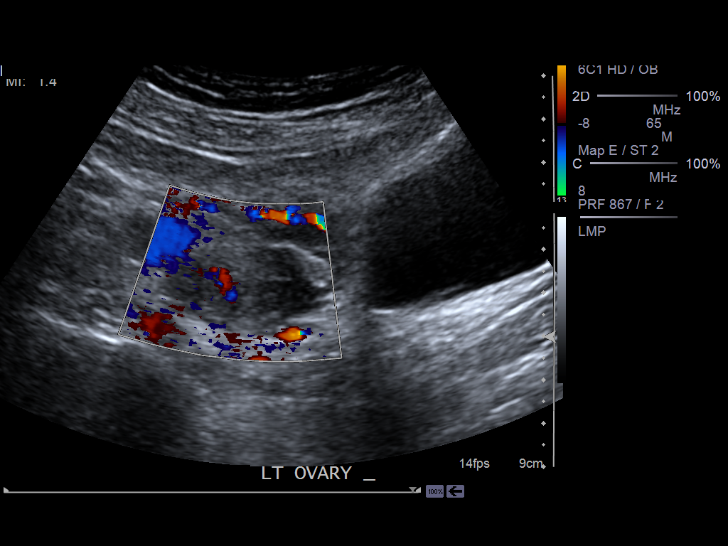
[im 28/83]
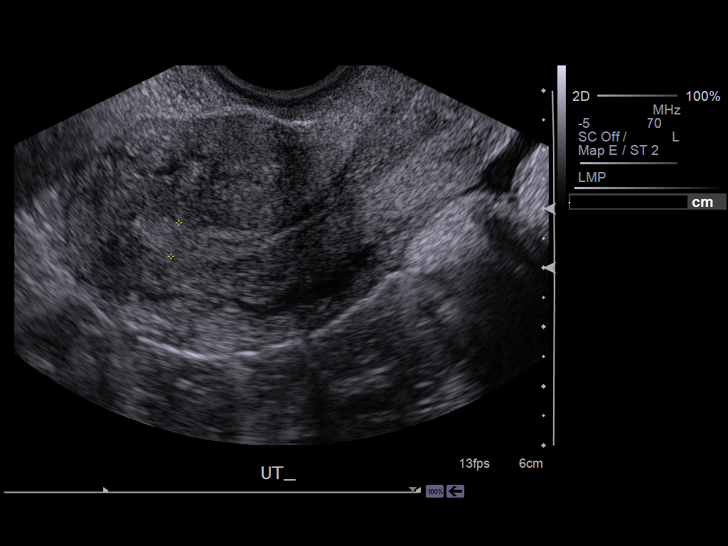
[im 34/83]
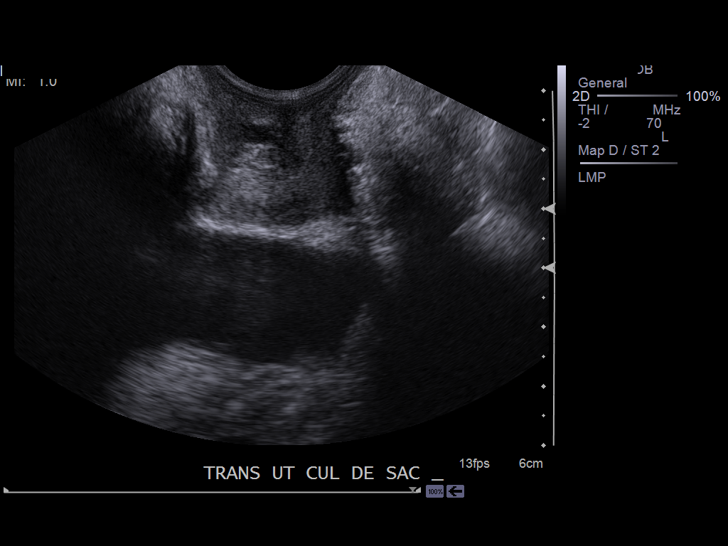
[im 43/83]
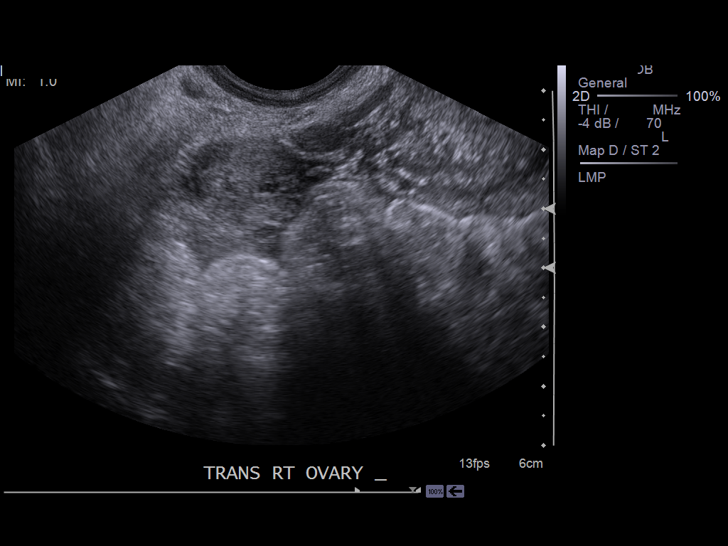
[im 49/83]
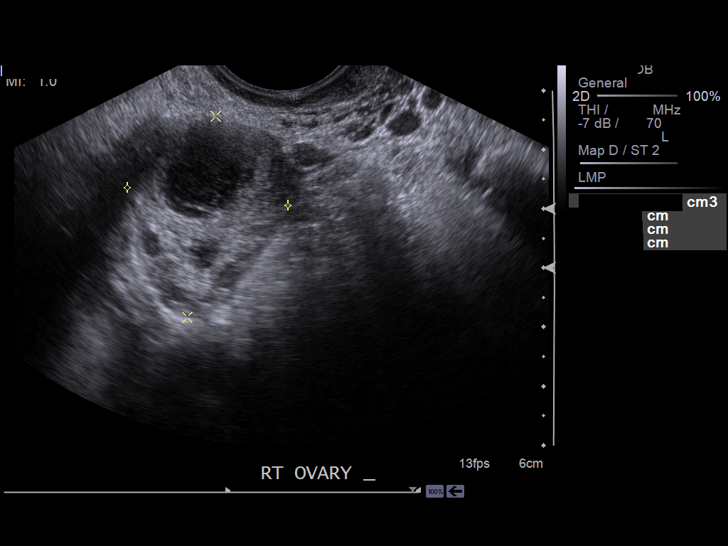
[im 55/83]
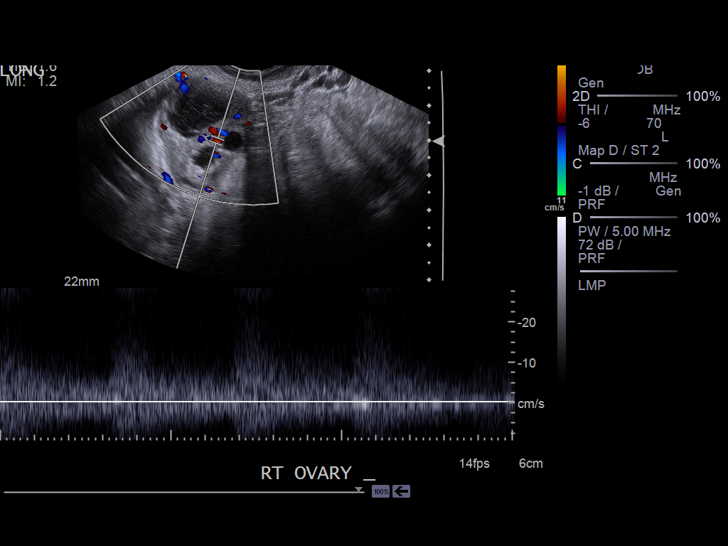
[im 61/83]
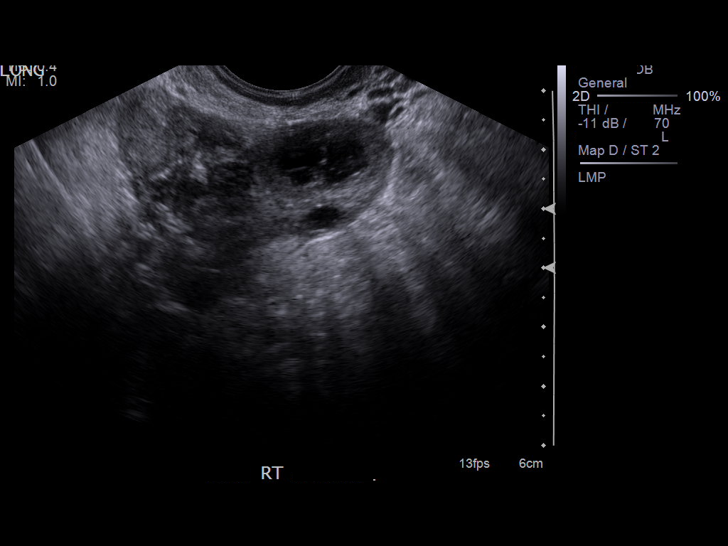
[im 67/83]
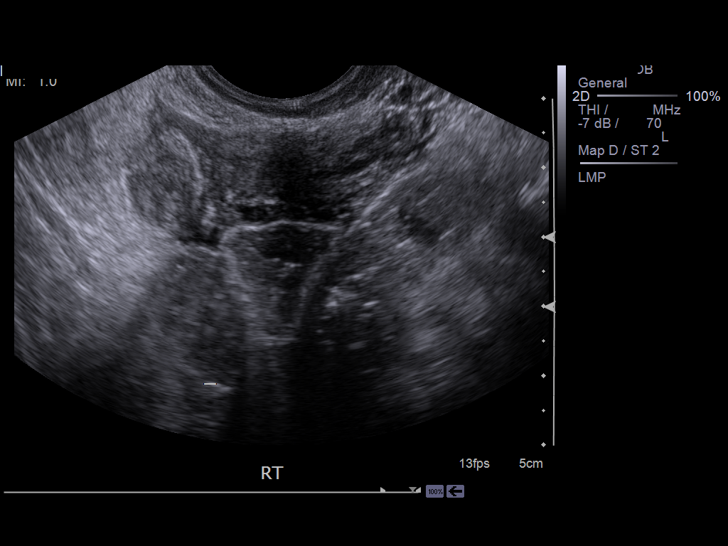
[im 73/83]
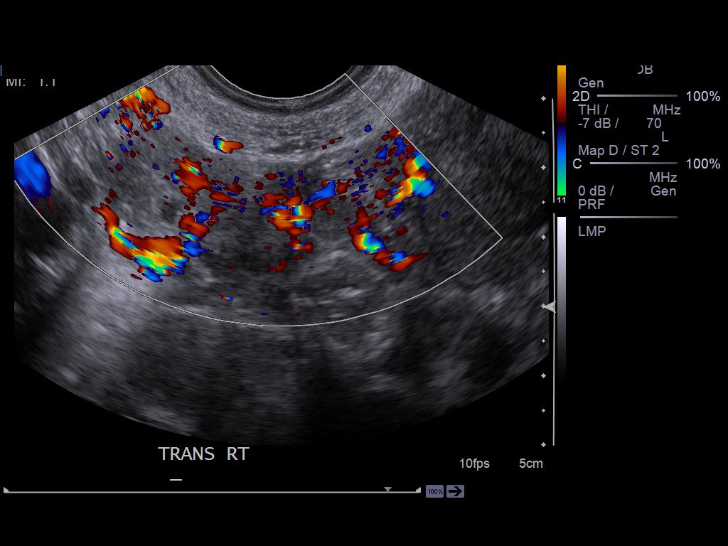
[im 79/83]
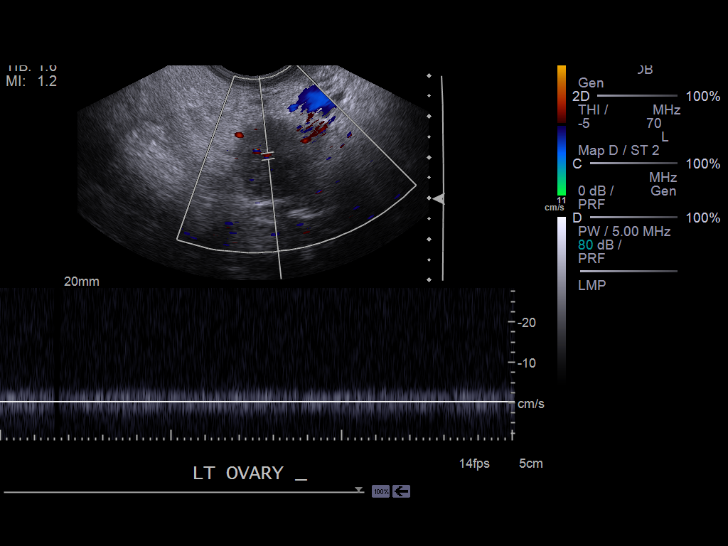

[13 of 28 positions shown; findings below may reference images not displayed]

PROCEDURE:     US  - US OB LESS THAN 14 WEEKS/W TRANS  - November 19, 2012  [DATE]

RESULT:     Pelvic ultrasound is performed utilizing an early OB protocol.
The patient has very low in elevated quantitative beta-hCG. The study is
performed with transabdominal and endovaginal scanning area. No gestational
sac is evident. There is no evidence of a focal myometrial mass. The uterus
measures 6.93 x 4.83 x 3.92 cm with an endometrial stripe thickness of
mm. The right ovary measures 2.61 x 3.43 x 2.73 cm with a roughly 2.0 x
cm hypoechoic structure in the right ovary which could represent a complex
cyst. There is a 2.4 x 1.8 cm right adnexal echogenic structure which is of
uncertain origin. This could represent a portion of the right ovary or mass.
The left ovary measures 2.88 x 1.76 x 1.94 cm. There is no definite solid or
cystic mass on the left ovary. Cul-de-sac fluid is seen.
IMPRESSION: No identifiable intrauterine gestation. Small moderate
amount of free fluid. Echogenic area adjacent to the right ovary which could
be a portion of the right ovary or mass. Gynecologic followup is recommended.

[REDACTED]

## 2015-07-30 IMAGING — CR DG ABDOMEN 1V
1 series · 2 of 2 positions shown · non-contrast
Comparison: none

REASON FOR EXAM: RUQ tenderness, diffuse pain
COMMENTS:

PROCEDURE:     MDR - MDR KIDNEY URETER BLADDER  - January 04, 2013  [DATE]
RESULT:

[Series 1: ap · 0.17mm/px · 2 of 2 slices shown]
[im 1/2]
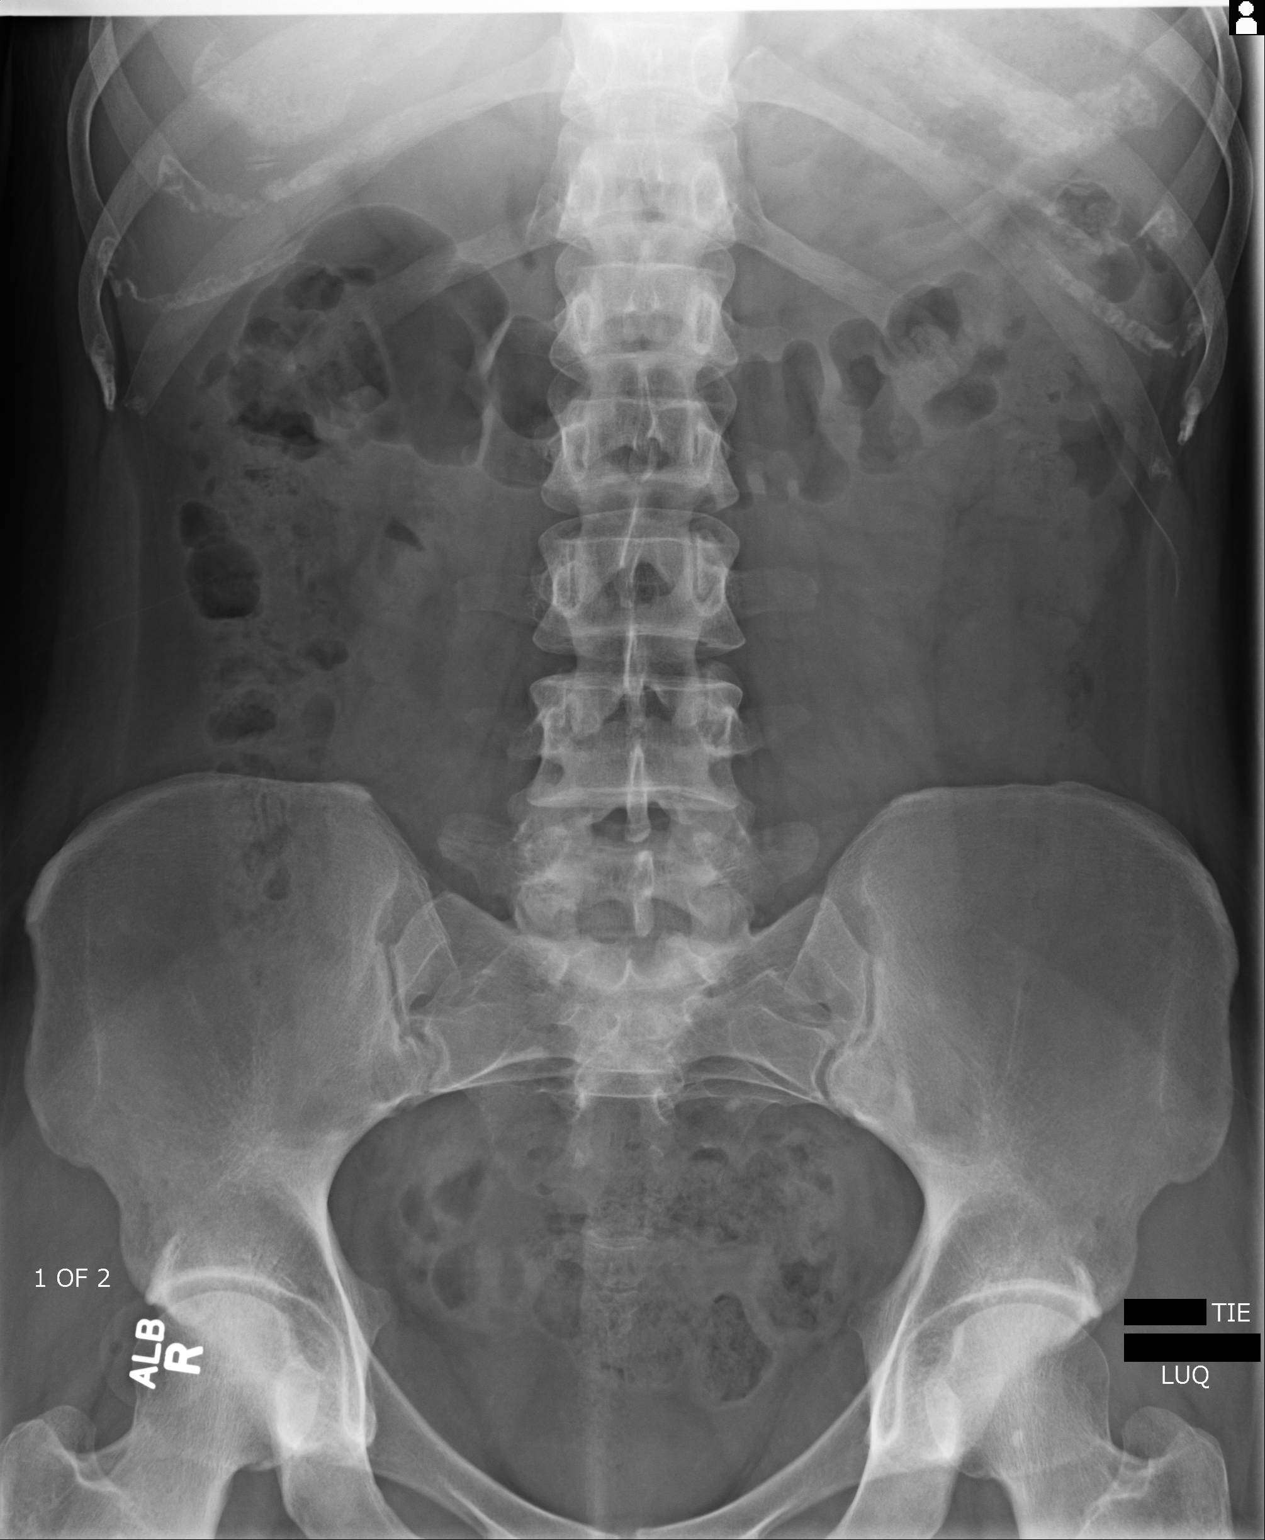
[im 2/2]
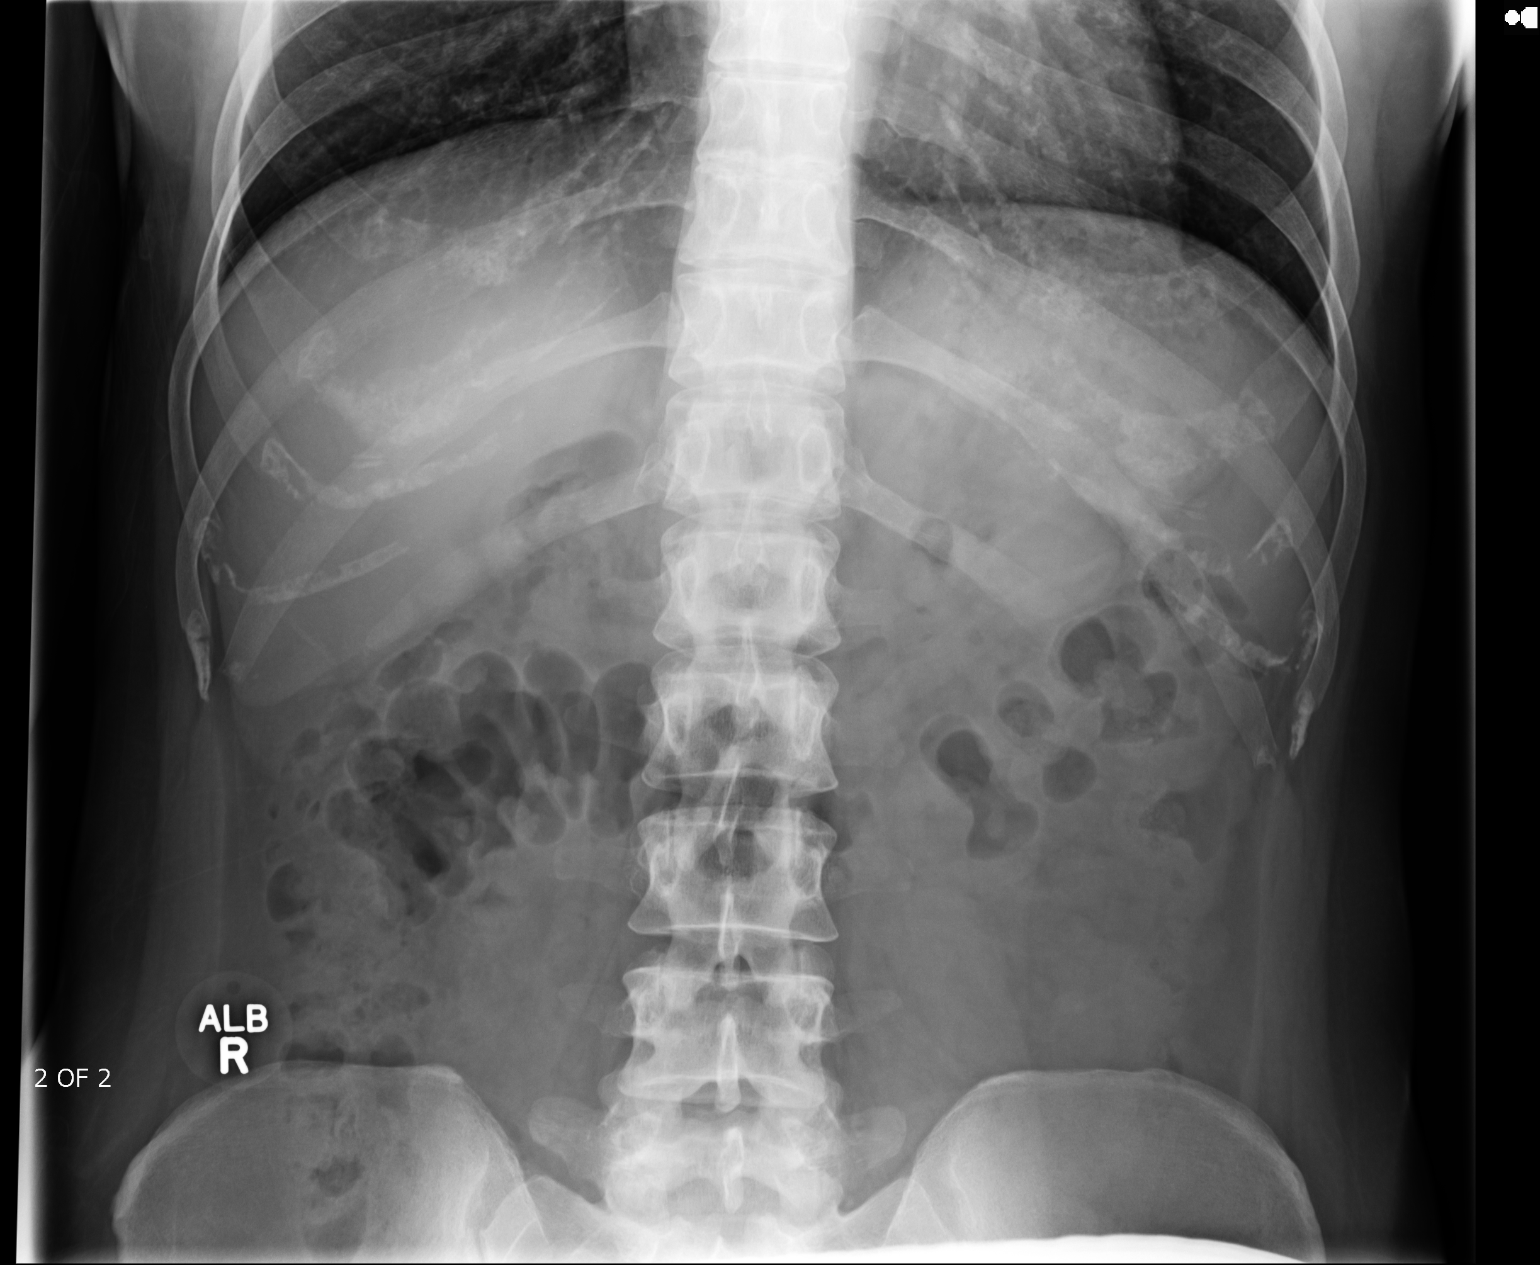

[2 of 2 positions shown; findings below may reference images not displayed]

FINDINGS: Air is seen within nondilated loops of large and small bowel. The
visualized bony skeleton demonstrates no evidence of fracture or dislocation.
IMPRESSION: Nonobstructive bowel gas pattern.

## 2015-07-30 IMAGING — CR DG CHEST 2V
1 series · 2 of 2 positions shown · non-contrast
Comparison: none

REASON FOR EXAM: Dyspnea, short of breath
COMMENTS:

PROCEDURE:     MDR - MDR CHEST PA(OR AP) AND LATERAL  - January 04, 2013  [DATE]
RESULT:     The lungs are clear. The cardiac silhouette and visualized bony
skeleton are unremarkable.

[Series 1: pa · 0.17mm/px · 2 of 2 slices shown]
[im 1/2]
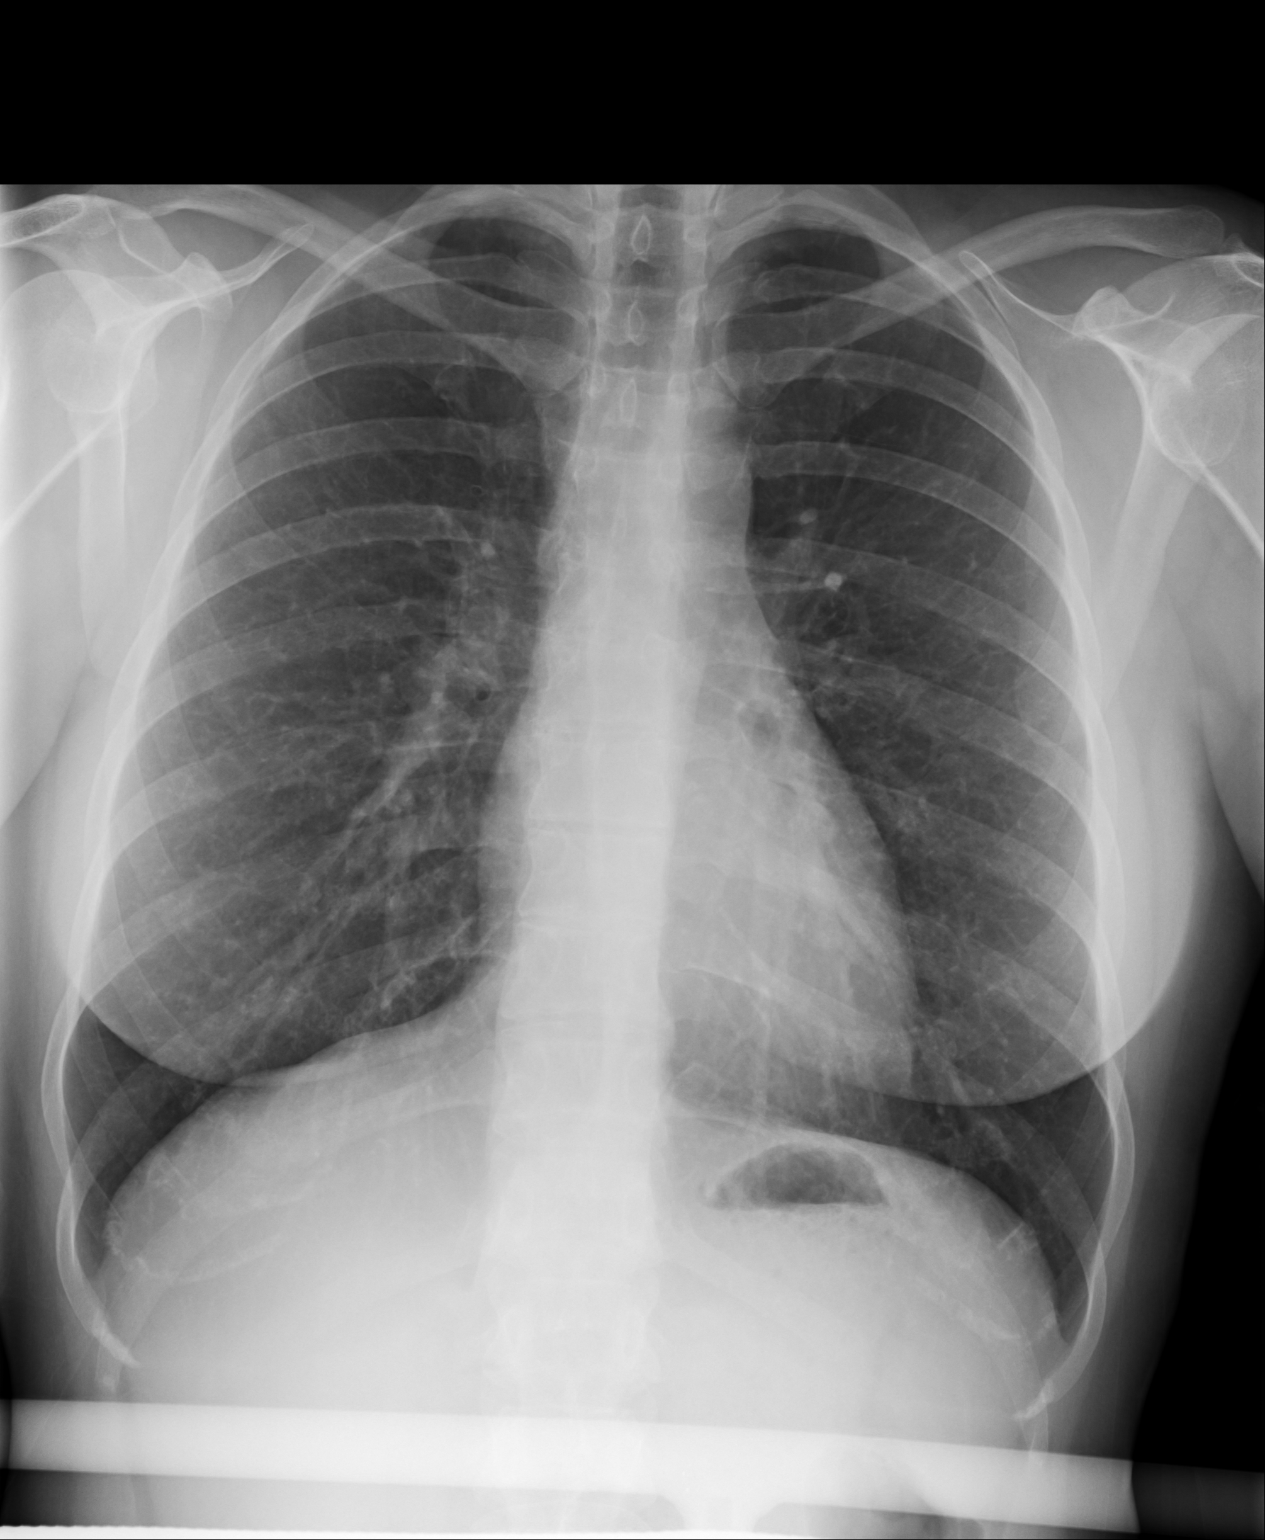
[im 2/2]
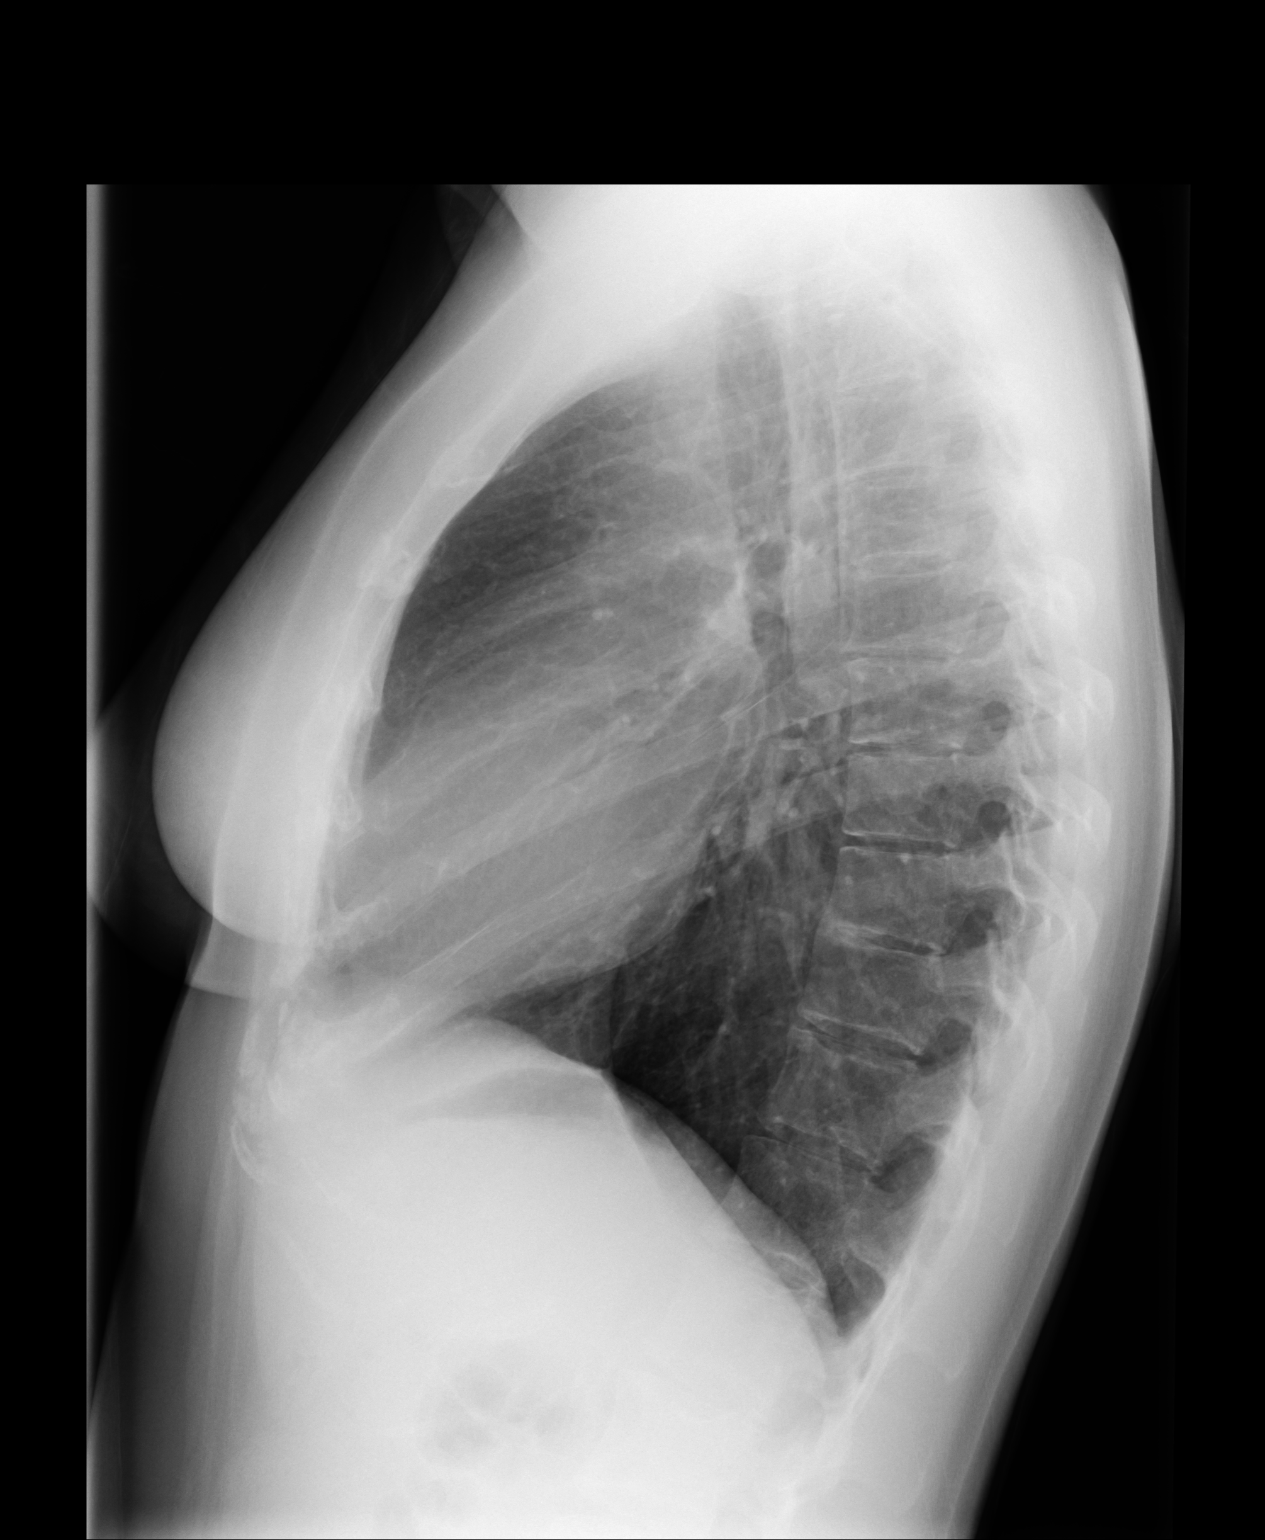

[2 of 2 positions shown; findings below may reference images not displayed]

IMPRESSION: 1. Chest radiograph without evidence of acute cardiopulmonary disease.
2. Comparison made to prior study dated 04/30/2011
